# Patient Record
Sex: Male | Born: 1985 | Race: Black or African American | Hispanic: No | Marital: Single | Smoking: Current every day smoker
Health system: Southern US, Community
[De-identification: ages and names within clinical notes are randomized; demographics above are authoritative.]

## PROBLEM LIST (undated history)

## (undated) DIAGNOSIS — S62613A Displaced fracture of proximal phalanx of left middle finger, initial encounter for closed fracture: Secondary | ICD-10-CM

## (undated) DIAGNOSIS — S92353A Displaced fracture of fifth metatarsal bone, unspecified foot, initial encounter for closed fracture: Secondary | ICD-10-CM

## (undated) DIAGNOSIS — R0600 Dyspnea, unspecified: Secondary | ICD-10-CM

## (undated) DIAGNOSIS — S61412A Laceration without foreign body of left hand, initial encounter: Secondary | ICD-10-CM

## (undated) DIAGNOSIS — S6290XA Unspecified fracture of unspecified wrist and hand, initial encounter for closed fracture: Secondary | ICD-10-CM

## (undated) DIAGNOSIS — R0609 Other forms of dyspnea: Secondary | ICD-10-CM

## (undated) DIAGNOSIS — S62609A Fracture of unspecified phalanx of unspecified finger, initial encounter for closed fracture: Secondary | ICD-10-CM

## (undated) HISTORY — PX: MYRINGOTOMY: SUR874

## (undated) HISTORY — PX: HAND SURGERY: SHX662

---
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
## 2005-10-09 ENCOUNTER — Emergency Department (HOSPITAL_COMMUNITY): Admission: EM | Admit: 2005-10-09 | Discharge: 2005-10-09 | Payer: Self-pay | Admitting: Emergency Medicine

## 2006-01-06 ENCOUNTER — Emergency Department (HOSPITAL_COMMUNITY): Admission: EM | Admit: 2006-01-06 | Discharge: 2006-01-06 | Payer: Self-pay | Admitting: Emergency Medicine

## 2006-05-03 ENCOUNTER — Emergency Department (HOSPITAL_COMMUNITY): Admission: EM | Admit: 2006-05-03 | Discharge: 2006-05-03 | Payer: Self-pay | Admitting: Family Medicine

## 2009-01-04 ENCOUNTER — Emergency Department (HOSPITAL_COMMUNITY): Admission: EM | Admit: 2009-01-04 | Discharge: 2009-01-04 | Payer: Self-pay | Admitting: Emergency Medicine

## 2010-02-20 ENCOUNTER — Emergency Department (HOSPITAL_COMMUNITY): Admission: EM | Admit: 2010-02-20 | Discharge: 2010-02-20 | Payer: Self-pay | Admitting: Emergency Medicine

## 2010-03-04 ENCOUNTER — Emergency Department (HOSPITAL_COMMUNITY): Admission: EM | Admit: 2010-03-04 | Discharge: 2010-03-04 | Payer: Self-pay | Admitting: Family Medicine

## 2010-05-11 ENCOUNTER — Emergency Department (HOSPITAL_COMMUNITY): Admission: EM | Admit: 2010-05-11 | Discharge: 2010-05-11 | Payer: Self-pay | Admitting: Emergency Medicine

## 2010-05-30 ENCOUNTER — Emergency Department (HOSPITAL_COMMUNITY): Admission: EM | Admit: 2010-05-30 | Discharge: 2010-05-30 | Payer: Self-pay | Admitting: Emergency Medicine

## 2010-12-29 LAB — GC/CHLAMYDIA PROBE AMP, GENITAL
Chlamydia, DNA Probe: NEGATIVE
GC Probe Amp, Genital: NEGATIVE

## 2011-01-01 LAB — GC/CHLAMYDIA PROBE AMP, GENITAL
Chlamydia, DNA Probe: NEGATIVE
GC Probe Amp, Genital: NEGATIVE

## 2011-01-01 LAB — POCT URINALYSIS DIP (DEVICE)
Protein, ur: 30 mg/dL — AB
Specific Gravity, Urine: >=1.03 (ref 1.005–1.030)
Urobilinogen, UA: 1 mg/dL (ref 0.0–1.0)

## 2011-01-25 LAB — GC/CHLAMYDIA PROBE AMP, GENITAL: GC Probe Amp, Genital: POSITIVE — AB

## 2012-06-20 ENCOUNTER — Emergency Department (HOSPITAL_COMMUNITY)
Admission: EM | Admit: 2012-06-20 | Discharge: 2012-06-20 | Disposition: A | Discharge status: Home / Self Care | Payer: Self-pay | Source: Home / Self Care | Attending: Emergency Medicine | Admitting: Emergency Medicine

## 2012-06-20 ENCOUNTER — Encounter (HOSPITAL_COMMUNITY): Payer: Self-pay | Admitting: Emergency Medicine

## 2012-06-20 DIAGNOSIS — R369 Urethral discharge, unspecified: Secondary | ICD-10-CM

## 2012-06-20 DIAGNOSIS — Z202 Contact with and (suspected) exposure to infections with a predominantly sexual mode of transmission: Secondary | ICD-10-CM

## 2012-06-20 MED ORDER — AZITHROMYCIN 250 MG PO TABS
1000.0000 mg | ORAL_TABLET | Freq: Every day | ORAL | Status: DC
  Administered 2012-06-20: 1000 mg via ORAL

## 2012-06-20 MED ORDER — CEFTRIAXONE SODIUM 250 MG IJ SOLR
INTRAMUSCULAR | Status: AC
  Filled 2012-06-20: qty 250

## 2012-06-20 MED ORDER — AZITHROMYCIN 250 MG PO TABS
ORAL_TABLET | ORAL | Status: AC
  Filled 2012-06-20: qty 4

## 2012-06-20 MED ORDER — CEFTRIAXONE SODIUM 250 MG IJ SOLR
250.0000 mg | Freq: Once | INTRAMUSCULAR | Status: AC
  Administered 2012-06-20: 250 mg via INTRAMUSCULAR

## 2012-06-20 MED ORDER — METRONIDAZOLE 500 MG PO TABS: 500.0000 mg | ORAL_TABLET | Freq: Three times a day (TID) | ORAL | Status: AC

## 2012-06-20 MED ORDER — LIDOCAINE HCL (PF) 1 % IJ SOLN
INTRAMUSCULAR | Status: AC
  Filled 2012-06-20: qty 5

## 2012-06-20 NOTE — ED Notes (Addendum)
Pt c/o poss exposure to STD x1 week... States he had unprotected intercourse w/girlfriend who notified him that she is being treated for chlamydia.... Pt c/o lower abd pain and white discharge from penile area w/episodes of vomiting and diarrhea... Also hurts when he urinates.

## 2012-06-20 NOTE — ED Provider Notes (Signed)
History     CSN: [id number]  Arrival date & time 06/20/12  0801   None     Chief Complaint  Patient presents with  . Exposure to STD    (Consider location/radiation/quality/duration/timing/severity/associated sxs/prior treatment) Patient is a 26 y.o. male presenting with STD exposure. The history is provided by the patient.  Exposure to STD This is a new problem. Associated symptoms include abdominal pain.  26 y.o. male complains of yellow penis discharge for 3 days.  +pelvic pain, -fever. + UTI symptoms. Sexually active, sometimes uses condoms, new partner.  Last unprotected intercourse 7 days ago.  Partner called him last week and states + for gonorrhea and chlamydia.  Patient has a  +trich history 2 years ago.  History reviewed. No pertinent past medical history.  History reviewed. No pertinent past surgical history.  History reviewed. No pertinent family history.  History  Substance Use Topics  . Smoking status: Current Everyday Smoker  . Smokeless tobacco: Not on file  . Alcohol Use: Yes      Review of Systems  Constitutional: Negative.   HENT: Negative.   Respiratory: Negative.   Cardiovascular: Negative.   Gastrointestinal: Positive for abdominal pain and diarrhea. Negative for nausea and vomiting.  Genitourinary: Positive for dysuria and discharge. Negative for hematuria, flank pain, decreased urine volume, penile pain and testicular pain.    Allergies  Review of patient's allergies indicates no known allergies.  Home Medications   Current Outpatient Rx  Name Route Sig Dispense Refill  . METRONIDAZOLE 500 MG PO TABS Oral Take 1 tablet (500 mg total) by mouth 3 (three) times daily. 4 tablet 0    BP 127/74  Pulse 83  Temp 98.1 F (36.7 C) (Oral)  Resp 18  SpO2 100%  Physical Exam  Nursing note and vitals reviewed. Constitutional: He is oriented to person, place, and time. Vital signs are normal. He appears well-developed and well-nourished. He is  active and cooperative.  HENT:  Head: Normocephalic.  Mouth/Throat: Oropharynx is clear and moist. No oropharyngeal exudate.  Eyes: Conjunctivae are normal. Pupils are equal, round, and reactive to light. No scleral icterus.  Neck: Trachea normal. Neck supple.  Cardiovascular: Normal rate, regular rhythm and normal heart sounds.   Pulmonary/Chest: Effort normal and breath sounds normal.  Abdominal: Soft. There is tenderness.  Genitourinary: Penis normal. Cremasteric reflex is present. Right testis shows tenderness. Left testis shows tenderness. Circumcised.  Lymphadenopathy:    He has no cervical adenopathy.    He has no axillary adenopathy.       Right: Inguinal adenopathy present.       Left: Inguinal adenopathy present.  Neurological: He is alert and oriented to person, place, and time. No cranial nerve deficit or sensory deficit.  Skin: Skin is warm and dry. No rash noted.  Psychiatric: He has a normal mood and affect. His speech is normal and behavior is normal. Judgment and thought content normal. Cognition and memory are normal.    ED Course  Procedures (including critical care time)   Labs Reviewed  GC/CHLAMYDIA PROBE AMP, GENITAL  RPR  HIV ANTIBODY (ROUTINE TESTING)   No results found.   1. Exposure to STD   2. Penile discharge       MDM  STD exposure.  Sent off GC/chlamydia, HIV, RPR. Will treat empirically now. Giving ceftriaxone 250 mg IM/azithro 1 gm po. Will send home with flagyl abx.  Advised pt to refrain from sexual contact for seven days.  Partners will  need screening and treatment.           Johnsie Kindred, NP 06/20/12 [phone]

## 2012-06-20 NOTE — Discharge Instructions (Signed)
Gonorrhea, Females and Males  Gonorrhea is an infection. Gonorrhea can be treated with medicines that kill germs (antibiotics). It is necessary that all your sexual partners also be tested for infection and possibly be treated.   CAUSES   Gonorrhea is caused by a germ (bacteria) called Neisseria gonorrhoeae. This infection is spread by sexual contact. The contact that spreads gonorrhea from person to person may be oral, anal, or genital sex.  SYMPTOMS   Females  A woman may have gonorrhea infection and no symptoms. The most common symptoms are:   Pain in the lower abdomen.   Fever, with or without chills.  When these are the most serious problems, the illness is commonly called pelvic inflammatory disease (PID). Other symptoms include:   Abnormal vaginal discharge.   Painful intercourse.   Burning or itching of the vagina or lips of the vagina.   Abnormal vaginal bleeding.   Pain when urinating.  If the infection is spread by anal sex:   Irritation, pain, bleeding, or discharge from the rectum.  If the infection is spread by oral sex with either a man or a woman:   Sore throat, fever, and swollen neck lymph glands.  Other problems may include:   Long-lasting (chronic) pain in the lower abdomen during menstruation, intercourse, or at other times.   Inability to become pregnant.   Premature birth.   Passing the infection onto a newborn baby. This can cause an eye infection in the infant or more serious health problems.  Males  Less frequently than in women, men may have gonorrhea infection and no symptoms. The most common symptoms are:   Discharge from the penis.   Pain or burning during urination.  If the infection is spread by anal sex:   Irritation, pain, bleeding, or discharge from the rectum.  If the infection is spread by oral sex with either a man or a woman:   Sore throat, fever, and swollen neck lymph glands.  DIAGNOSIS   Diagnosis is made by exam of the patient and checking a sample of  discharge under a microscope for the presence of the bacteria. Discharge may be taken from the urethra, cervix, throat, or rectum.  TREATMENT   It is important to diagnose and treat gonorrhea as soon as possible. This prevents damage to the male or male organs or harm to the newborn baby of an infected woman.   Antibiotics are used to treat gonorrhea.   Your sex partners should also be examined and treated if needed.   Testing and treatment for other sexually transmitted diseases (STDs) may be done when you are diagnosed with gonorrhea. Gonorrhea is an STD. You are at risk for other STDs, which are often transmitted around the same time as gonorrhea. These include:   Chlamydia.   Syphilis.   Trichomonas.   Human papillomavirus (HPV).   Human immunodeficiency virus (HIV).   If left untreated, PID can cause women to be unable to have children (sterile). To prevent sterility in females, it is important to be treated as soon as possible and finish all medicines. Unfortunately, sterility or pregnancy occurring outside the uterus (ectopic) may still occur in fully treated women.  HOME CARE INSTRUCTIONS    Finish all medicine as prescribed. Incomplete treatment will put you at risk for continued infection.   Only take over-the-counter or prescription medicines for pain, discomfort, or fever as directed by your caregiver.   Do not have sex until treatment is completed, or as instructed   by your caregiver.   Follow up with your caregiver as directed.   If you test positive for gonorrhea, inform your recent sexual partners. They may need an exam and treatment, even if they have no symptoms. They may need treatment even if they test negative for gonorrhea.  Finding out the results of your test  Not all test results are available during your visit. If your test results are not back during the visit, make an appointment with your caregiver to find out the results. Do not assume everything is normal if you have not  heard from your caregiver or the medical facility. It is important for you to follow up on all of your test results.  SEEK MEDICAL CARE IF:    You develop any bad reaction to the medicine you were prescribed. This may include:   Rash.   Nausea.   Vomiting.   Diarrhea.   You have an oral temperature above 102 F (38.9 C).   You have symptoms that do not improve, symptoms that get worse, or you develop increased pain. Males may get pain in the testicles and females may get increased abdominal pain.  MAKE SURE YOU:    Understand these instructions.   Will watch your condition.   Will get help right away if you are not doing well or get worse.  Document Released: 09/28/2000 Document Revised: 09/20/2011 Document Reviewed: 01/31/2010  Exit[employer] Patient Information 2012 [employer].

## 2012-06-20 NOTE — ED Notes (Signed)
Call from pharmacy, requesting clarification on medication dose schedule; spoke w provider, and then directly w pharmacist to give instructions

## 2012-06-21 LAB — GC/CHLAMYDIA PROBE AMP, GENITAL: Chlamydia, DNA Probe: NEGATIVE

## 2012-06-22 NOTE — ED Provider Notes (Signed)
Medical screening examination/treatment/procedure(s) were performed by non-physician practitioner and as supervising physician I was immediately available for consultation/collaboration.  Karsin Pesta M. MD   Maleni Seyer M Brixton Schnapp, MD 06/22/12 2132 

## 2013-10-04 ENCOUNTER — Encounter (HOSPITAL_COMMUNITY): Payer: Self-pay | Admitting: Emergency Medicine

## 2013-10-04 ENCOUNTER — Emergency Department (INDEPENDENT_AMBULATORY_CARE_PROVIDER_SITE_OTHER)
Admission: EM | Admit: 2013-10-04 | Discharge: 2013-10-04 | Disposition: A | Discharge status: Home / Self Care | Payer: Medicaid Other | Source: Home / Self Care | Attending: Family Medicine | Admitting: Family Medicine

## 2013-10-04 ENCOUNTER — Other Ambulatory Visit (HOSPITAL_COMMUNITY)
Admission: RE | Admit: 2013-10-04 | Discharge: 2013-10-04 | Disposition: A | Discharge status: Home / Self Care | Payer: Medicaid Other | Source: Ambulatory Visit | Attending: Family Medicine | Admitting: Family Medicine

## 2013-10-04 DIAGNOSIS — Z113 Encounter for screening for infections with a predominantly sexual mode of transmission: Secondary | ICD-10-CM | POA: Insufficient documentation

## 2013-10-04 DIAGNOSIS — N342 Other urethritis: Secondary | ICD-10-CM

## 2013-10-04 DIAGNOSIS — Z202 Contact with and (suspected) exposure to infections with a predominantly sexual mode of transmission: Secondary | ICD-10-CM

## 2013-10-04 LAB — POCT URINALYSIS DIP (DEVICE)
Bilirubin Urine: NEGATIVE
Glucose, UA: NEGATIVE mg/dL
Hgb urine dipstick: NEGATIVE
Ketones, ur: NEGATIVE mg/dL
Specific Gravity, Urine: 1.015 (ref 1.005–1.030)

## 2013-10-04 MED ORDER — CEFTRIAXONE SODIUM 250 MG IJ SOLR
250.0000 mg | Freq: Once | INTRAMUSCULAR | Status: AC
  Administered 2013-10-04: 250 mg via INTRAMUSCULAR

## 2013-10-04 MED ORDER — CEFTRIAXONE SODIUM 250 MG IJ SOLR
INTRAMUSCULAR | Status: AC
  Filled 2013-10-04: qty 250

## 2013-10-04 MED ORDER — AZITHROMYCIN 250 MG PO TABS
ORAL_TABLET | ORAL | Status: AC
  Filled 2013-10-04: qty 4

## 2013-10-04 MED ORDER — AZITHROMYCIN 250 MG PO TABS
1000.0000 mg | ORAL_TABLET | Freq: Every day | ORAL | Status: DC
  Administered 2013-10-04: 1000 mg via ORAL

## 2013-10-04 MED ORDER — LIDOCAINE HCL (PF) 1 % IJ SOLN
INTRAMUSCULAR | Status: AC
  Filled 2013-10-04: qty 5

## 2013-10-04 MED ORDER — METRONIDAZOLE 500 MG PO TABS: 1000.0000 mg | ORAL_TABLET | Freq: Two times a day (BID) | ORAL | Status: DC

## 2013-10-04 NOTE — ED Notes (Signed)
27 yr old is here today with complaints of penile discharge-pale yellow and abdominal pain for 4 dys. He states that he was advised by the person he was intimate with had been Dx Gonorrhea, chlamydia, and Trichomonas.  Denies: SOB, chest pain

## 2013-10-04 NOTE — ED Provider Notes (Signed)
CSN: [id number]     Arrival date & time 10/04/13  1149 History   First MD Initiated Contact with Patient 10/04/13 1238     Chief Complaint  Patient presents with  . Exposure to STD   (Consider location/radiation/quality/duration/timing/severity/associated sxs/prior Treatment) HPI Comments: A 27-year-old male presents complaining of penile discharge, lower abdominal pain, diarrhea, sore throat. This is been going on for about 2 days. He was called by someone he was intimate with recently, she was treated for Chlamydia, gonorrhea, Trichomonas. There is no blood in the diarrhea. He believes the area may actually be because he drank a lot this weekend. No fever, chills, nausea, vomiting, genital lesions, testicle pain  Patient is a 27 y.o. male presenting with STD exposure.  Exposure to STD Associated symptoms include abdominal pain. Pertinent negatives include no chest pain and no shortness of breath.    History reviewed. No pertinent past medical history. History reviewed. No pertinent past surgical history. No family history on file. History  Substance Use Topics  . Smoking status: Current Every Day Smoker  . Smokeless tobacco: Not on file  . Alcohol Use: Yes    Review of Systems  Constitutional: Positive for appetite change. Negative for fever, chills and fatigue.  HENT: Positive for sore throat.   Eyes: Negative for visual disturbance.  Respiratory: Negative for cough and shortness of breath.   Cardiovascular: Negative for chest pain, palpitations and leg swelling.  Gastrointestinal: Positive for abdominal pain. Negative for nausea, vomiting, diarrhea and constipation.  Genitourinary: Positive for discharge. Negative for dysuria, urgency, frequency, hematuria and penile pain.  Musculoskeletal: Negative for arthralgias, myalgias, neck pain and neck stiffness.  Skin: Negative for rash.  Neurological: Negative for dizziness, weakness and light-headedness.    Allergies  Review of  patient's allergies indicates no known allergies.  Home Medications   Current Outpatient Rx  Name  Route  Sig  Dispense  Refill  . metroNIDAZOLE (FLAGYL) 500 MG tablet   Oral   Take 2 tablets (1,000 mg total) by mouth every 12 (twelve) hours.   4 tablet   0    BP 121/67  Pulse 63  Temp(Src) 98.4 F (36.9 C) (Oral)  Resp 14  SpO2 100% Physical Exam  Nursing note and vitals reviewed. Constitutional: He is oriented to person, place, and time. He appears well-developed and well-nourished. No distress.  HENT:  Head: Normocephalic and atraumatic.  Mouth/Throat: Mucous membranes are normal. Oropharyngeal exudate (left tonsil) and posterior oropharyngeal erythema present.  Pulmonary/Chest: Effort normal. No respiratory distress.  Abdominal: Hernia confirmed negative in the right inguinal area and confirmed negative in the left inguinal area.  Genitourinary: Testes normal. Right testis shows no mass, no swelling and no tenderness. Left testis shows no mass, no swelling and no tenderness. Discharge found.  Lymphadenopathy:       Right: Inguinal adenopathy present.       Left: Inguinal adenopathy present.  Neurological: He is alert and oriented to person, place, and time. Coordination normal.  Skin: Skin is warm and dry. No rash noted. He is not diaphoretic.  Psychiatric: He has a normal mood and affect. Judgment normal.    ED Course  Procedures (including critical care time) Labs Review Labs Reviewed  URINE CULTURE  RPR  HIV ANTIBODY (ROUTINE TESTING)  POCT URINALYSIS DIP (DEVICE)  POCT RAPID STREP A ([facility] CARE ONLY)  URINE CYTOLOGY ANCILLARY ONLY   Imaging Review No results found.    MDM   1. Exposure to STD  2. Urethritis    Treating for Gc, Ch, trich.  F/U if not improving   Meds ordered this encounter  Medications  . cefTRIAXone (ROCEPHIN) injection 250 mg    Sig:   . azithromycin (ZITHROMAX) tablet 1,000 mg    Sig:   . metroNIDAZOLE (FLAGYL) 500 MG  tablet    Sig: Take 2 tablets (1,000 mg total) by mouth every 12 (twelve) hours.    Dispense:  4 tablet    Refill:  0    Order Specific Question:  Supervising Provider    Answer:  Clementeen Graham, Kathie Rhodes [[id number]]       Graylon Good, PA-C 10/04/13 1332

## 2013-10-05 LAB — RPR: RPR Ser Ql: NONREACTIVE

## 2013-10-05 LAB — URINE CULTURE

## 2013-10-05 LAB — HIV ANTIBODY (ROUTINE TESTING W REFLEX): HIV: NONREACTIVE

## 2013-10-05 NOTE — ED Provider Notes (Signed)
Medical screening examination/treatment/procedure(s) were performed by a resident physician or non-physician practitioner and as the supervising physician I was immediately available for consultation/collaboration.  Clementeen Graham, MD    Rodolph Bong, MD 10/05/13 [phone]

## 2013-10-06 LAB — CULTURE, GROUP A STREP

## 2014-09-06 ENCOUNTER — Encounter (HOSPITAL_COMMUNITY): Payer: Self-pay | Admitting: *Deleted

## 2014-09-06 ENCOUNTER — Emergency Department (HOSPITAL_COMMUNITY): Payer: No Typology Code available for payment source

## 2014-09-06 ENCOUNTER — Emergency Department (HOSPITAL_COMMUNITY)
Admission: EM | Admit: 2014-09-06 | Discharge: 2014-09-06 | Disposition: A | Discharge status: Home / Self Care | Payer: No Typology Code available for payment source | Attending: Emergency Medicine | Admitting: Emergency Medicine

## 2014-09-06 DIAGNOSIS — Z792 Long term (current) use of antibiotics: Secondary | ICD-10-CM | POA: Insufficient documentation

## 2014-09-06 DIAGNOSIS — Y92410 Unspecified street and highway as the place of occurrence of the external cause: Secondary | ICD-10-CM | POA: Diagnosis not present

## 2014-09-06 DIAGNOSIS — Y998 Other external cause status: Secondary | ICD-10-CM | POA: Insufficient documentation

## 2014-09-06 DIAGNOSIS — S39012A Strain of muscle, fascia and tendon of lower back, initial encounter: Secondary | ICD-10-CM | POA: Diagnosis not present

## 2014-09-06 DIAGNOSIS — Y9389 Activity, other specified: Secondary | ICD-10-CM | POA: Insufficient documentation

## 2014-09-06 DIAGNOSIS — S169XXA Unspecified injury of muscle, fascia and tendon at neck level, initial encounter: Secondary | ICD-10-CM | POA: Diagnosis present

## 2014-09-06 DIAGNOSIS — S161XXA Strain of muscle, fascia and tendon at neck level, initial encounter: Secondary | ICD-10-CM | POA: Insufficient documentation

## 2014-09-06 DIAGNOSIS — Z720 Tobacco use: Secondary | ICD-10-CM | POA: Diagnosis not present

## 2014-09-06 DIAGNOSIS — T148XXA Other injury of unspecified body region, initial encounter: Secondary | ICD-10-CM

## 2014-09-06 DIAGNOSIS — R52 Pain, unspecified: Secondary | ICD-10-CM

## 2014-09-06 MED ORDER — CYCLOBENZAPRINE HCL 10 MG PO TABS: 10.0000 mg | ORAL_TABLET | Freq: Two times a day (BID) | ORAL | Status: DC | PRN

## 2014-09-06 MED ORDER — IBUPROFEN 800 MG PO TABS: 800.0000 mg | ORAL_TABLET | Freq: Three times a day (TID) | ORAL | Status: DC

## 2014-09-06 MED ORDER — TRAMADOL HCL 50 MG PO TABS: 50.0000 mg | ORAL_TABLET | Freq: Four times a day (QID) | ORAL | Status: DC | PRN
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
## 2015-01-20 ENCOUNTER — Emergency Department (HOSPITAL_COMMUNITY)
Admission: EM | Admit: 2015-01-20 | Discharge: 2015-01-20 | Disposition: A | Discharge status: Home / Self Care | Payer: Medicaid Other | Attending: Emergency Medicine | Admitting: Emergency Medicine

## 2015-01-20 ENCOUNTER — Encounter (HOSPITAL_COMMUNITY): Payer: Self-pay | Admitting: Emergency Medicine

## 2015-01-20 DIAGNOSIS — Z720 Tobacco use: Secondary | ICD-10-CM | POA: Insufficient documentation

## 2015-01-20 DIAGNOSIS — Y998 Other external cause status: Secondary | ICD-10-CM | POA: Insufficient documentation

## 2015-01-20 DIAGNOSIS — S4991XA Unspecified injury of right shoulder and upper arm, initial encounter: Secondary | ICD-10-CM | POA: Insufficient documentation

## 2015-01-20 DIAGNOSIS — Y9389 Activity, other specified: Secondary | ICD-10-CM | POA: Insufficient documentation

## 2015-01-20 DIAGNOSIS — Y92410 Unspecified street and highway as the place of occurrence of the external cause: Secondary | ICD-10-CM | POA: Insufficient documentation

## 2015-01-20 DIAGNOSIS — S8991XA Unspecified injury of right lower leg, initial encounter: Secondary | ICD-10-CM | POA: Insufficient documentation

## 2015-01-20 MED ORDER — IBUPROFEN 200 MG PO TABS
600.0000 mg | ORAL_TABLET | Freq: Once | ORAL | Status: AC
  Administered 2015-01-20: 600 mg via ORAL
  Filled 2015-01-20: qty 3

## 2015-01-20 MED ORDER — IBUPROFEN 600 MG PO TABS: 600.0000 mg | ORAL_TABLET | Freq: Three times a day (TID) | ORAL | Status: DC
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
## 2016-01-16 ENCOUNTER — Emergency Department (HOSPITAL_COMMUNITY): Payer: Self-pay

## 2016-01-16 ENCOUNTER — Emergency Department (HOSPITAL_COMMUNITY)
Admission: EM | Admit: 2016-01-16 | Discharge: 2016-01-16 | Disposition: A | Discharge status: Home / Self Care | Payer: Self-pay | Attending: Emergency Medicine | Admitting: Emergency Medicine

## 2016-01-16 ENCOUNTER — Encounter (HOSPITAL_COMMUNITY): Payer: Self-pay

## 2016-01-16 DIAGNOSIS — Y9389 Activity, other specified: Secondary | ICD-10-CM | POA: Insufficient documentation

## 2016-01-16 DIAGNOSIS — Y998 Other external cause status: Secondary | ICD-10-CM | POA: Insufficient documentation

## 2016-01-16 DIAGNOSIS — Y9289 Other specified places as the place of occurrence of the external cause: Secondary | ICD-10-CM | POA: Insufficient documentation

## 2016-01-16 DIAGNOSIS — F17210 Nicotine dependence, cigarettes, uncomplicated: Secondary | ICD-10-CM | POA: Insufficient documentation

## 2016-01-16 DIAGNOSIS — S92352A Displaced fracture of fifth metatarsal bone, left foot, initial encounter for closed fracture: Secondary | ICD-10-CM | POA: Insufficient documentation

## 2016-01-16 DIAGNOSIS — X58XXXA Exposure to other specified factors, initial encounter: Secondary | ICD-10-CM | POA: Insufficient documentation

## 2016-01-16 DIAGNOSIS — S99912A Unspecified injury of left ankle, initial encounter: Secondary | ICD-10-CM | POA: Insufficient documentation

## 2016-01-16 MED ORDER — TRAMADOL HCL 50 MG PO TABS: 50.0000 mg | ORAL_TABLET | Freq: Once | ORAL | Status: AC

## 2016-01-16 MED ORDER — IBUPROFEN 600 MG PO TABS: 600.0000 mg | ORAL_TABLET | Freq: Four times a day (QID) | ORAL | Status: DC | PRN

## 2016-01-16 MED ORDER — TRAMADOL HCL 50 MG PO TABS: 50.0000 mg | ORAL_TABLET | Freq: Once | ORAL | Status: DC

## 2016-01-16 NOTE — ED Notes (Signed)
Pt c/o L foot/ankle pain x 3 days.  Pain score 9/10.  Pt reports taking OTC pain medication w/o relief.  Pt reports that he "twisted" his ankle.  No swelling noted.

## 2016-01-16 NOTE — ED Notes (Signed)
Patient transported to X-ray 

## 2016-01-16 NOTE — Discharge Instructions (Signed)
You have been placed in a splint/cast that can be removed at night and for bathing If you are up and about you MUST wear the splint Call the orthopedist for follow   Metatarsal Fracture With Rehab A metatarsal fracture is a broken bone in one of the five bones that connect your toes to the rest of your foot (forefoot fracture). Metatarsals are long bones that can be stressed or cracked easily. A metatarsal fracture can be:  A stress fracture. Stress fractures are cracks in the surface of the metatarsal bone. Athletes often get stress fractures.  A complete fracture. A complete fracture goes all the way through the bone. The bone that connects to the pinky toe (fifth metatarsal) is the most commonly fractured metatarsal. Ballet dancers often fracture this bone. CAUSES  Stress fractures may be caused by:  Poor training technique.  Sudden increase in activity.  Changing your activity to a harder surface.  Wearing athletic shoes that do not have enough cushioning. Complete fractures are usually caused by:  Dropping a heavy object on your foot.  An injury that severely twists your foot. SIGNS AND SYMPTOMS The most common symptom of a stress fracture is foot pain that goes away with rest. The most common symptom of a complete fracture is intense pain that persists after the injury. Other symptoms of both fracture types include:  Bruising.  Swelling.  Pain with movement or putting weight on the foot.  Tenderness or pain with pressure.  Trouble walking. DIAGNOSIS  Your health care provider may suspect a metatarsal fracture based on your symptoms and medical history. Your health care provider will also do a physical exam. During the physical exam, your health care provider may try to move your foot and toes to check for pain and limited movement. Your foot will also be checked for:  Bruising.  Tenderness.  Swelling.  Deformity. Other tests that may be done include:  X-rays.  X-rays are able to show most fractures.  A bone scan. This test may be necessary to show a stress fracture. TREATMENT  Treatment for a metatarsal fracture depends on how severe the fracture was and the type of fracture. Treatment may include:  Surgery. Surgery is usually needed to repair a displaced fracture. A displaced fracture happens when pieces of the broken bone are moved out of place (displacement). After surgery, you may need to wear a short walking cast for 6 to 8 weeks.  Medicines. Medicines may be used to reduce swelling and pain.  Physical therapy. Physical therapy may last for several months.  Use of a supportive device, such as:  Elastic wrap.  Splint.  Boot.  Cast.  Crutches to support weight on your foot until the broken bone heals. You can usually treat a stress fracture or a nondisplaced fracture with:   Rest.  Ice.  Elevation.  Support. HOME CARE INSTRUCTIONS  Follow all your health care provider's instructions.  Take medicine only as directed by your health care provider.  Rest your foot until your health care provider says you can resume your usual activities.  When resting, keep your foot raised above the level of your heart (elevated).  Ice may help reduce pain and swelling.  Place ice in a plastic bag.  Place a towel between your skin and the bag.  Leave the ice on for 20 minutes, 2-3 times a day.  Wear your supportive device as directed.  Do not get your cast or splint wet.  Keep all follow-up  visits as directed by your health care provider. If your health care provider recommended physical therapy, it is very important for proper healing of your injury that you keep all visits. PREVENTION  After your fracture has healed, you can prevent another fracture by:  Starting new sports activities gradually.  Cross training to avoid putting stress on the same part of your foot every day (such as alternate running with swimming or  biking).  Eat a healthy diet that includes plenty of calcium and vitamin D.  Wear the right athletic shoes for your sport. Replace them when they wear out.  Stop your activity or training if you have pain or swelling in your foot. Rest for a few days. SEEK MEDICAL CARE IF:  You have pain that is getting worse.  You develop chills or fever.  Your foot feels numb.  Your cast or splint is damaged.  You notice swelling or redness below your cast or splint. WHAT REHABILITATION EXERCISES CAN I DO AT HOME? Ask your health care provider or physical therapist when you can start doing exercises at home. Doing these exercises 3-5 times a week can help you regain strength and flexibility. If doing any of these exercises causes pain, stop and contact your health care provider or physical therapist. Heel Cord Stretch Do 2 sets of 10 repetitions.  Stand facing a wall with your healthy foot forward and your knee slightly bent.  Place both hands on the wall for support.  Stretch your healing foot out straight behind you.  Keep both heels on the floor.  Hold the stretch for 30 seconds.  You can also do this exercise with both knees slightly bent. Repeat the same steps. Golf BJ'sBall Roll Do this once every day.  Sit on a chair and roll a golf ball under your healing foot for two minutes. Towel Stretch  Sit on the floor with your legs out straight.  Loop a towel around the front of your healing foot.  Use both hands to pull the ends of the towel, stretching your foot back toward your body.  Hold the stretch for 30 seconds and repeat 3 times. Calf Raises Do 2 sets of 10 repetitions.  Stand behind a chair and use your hands to support you.  Lift your good foot off the ground.  Rise up on the front of your healing foot as high as you can.  Repeat the lift 10 times. Marble Pickup Do this once a day.  Sit in a chair and put 20 marbles on the floor in front of you.  Use the toes of  your healing foot to pick up each marble. Towel Curls Repeat this exercise 5 times.  Sit in a chair and place a small towel on the floor in front of you.  Use the toes of your healing foot to grab the towel and pull it toward you.   This information is not intended to replace advice given to you by your health care provider. Make sure you discuss any questions you have with your health care provider.   Document Released: 10/01/2005 Document Revised: 02/15/2015 Document Reviewed: 01/14/2014 Elsevier Interactive Patient Education [employer]
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
## 2016-01-18 ENCOUNTER — Telehealth: Payer: Self-pay | Admitting: *Deleted

## 2016-01-18 NOTE — Telephone Encounter (Signed)
Pharmacy called related to Rx: traMADol (ULTRAM) 50 MG tablet 01/16/16 -- Earley FavorGail Schulz, NP Take 1 tablet (50 mg total) by mouth once.  ..Marland Kitchen.EDCM clarified with EDP to change Rx to: q 8 hours PRN severe pain.

## 2016-08-17 ENCOUNTER — Emergency Department (HOSPITAL_COMMUNITY)
Admission: EM | Admit: 2016-08-17 | Discharge: 2016-08-17 | Disposition: A | Discharge status: Home / Self Care | Payer: Medicaid Other | Attending: Emergency Medicine | Admitting: Emergency Medicine

## 2016-08-17 ENCOUNTER — Encounter (HOSPITAL_COMMUNITY): Payer: Self-pay | Admitting: Emergency Medicine

## 2016-08-17 DIAGNOSIS — F17210 Nicotine dependence, cigarettes, uncomplicated: Secondary | ICD-10-CM | POA: Insufficient documentation

## 2016-08-17 DIAGNOSIS — J029 Acute pharyngitis, unspecified: Secondary | ICD-10-CM

## 2016-08-17 DIAGNOSIS — H9203 Otalgia, bilateral: Secondary | ICD-10-CM | POA: Insufficient documentation

## 2016-08-17 MED ORDER — IBUPROFEN 800 MG PO TABS: 800.0000 mg | ORAL_TABLET | Freq: Three times a day (TID) | ORAL | 0 refills | Status: DC

## 2016-08-17 NOTE — ED Notes (Signed)
Patient states he has bilateral ear pain and a sore throat. Also states he has nasal congestion.

## 2016-08-17 NOTE — ED Provider Notes (Signed)
[facility]-EMERGENCY DEPT Provider Note   CSN: [id number] Arrival date & time: 08/17/16  [id number]     History   Chief Complaint Chief Complaint  Patient presents with  . Otalgia  . Sore Throat    HPI Alfred Morrison is a 30 y.o. male.  HPI History reviewed. No pertinent past medical history. Patient states he has a earache and sore throat. These have been present for about a week. Both ears hurt. Symptoms are made worse by wind blowing on his ears. No known fever. No difficulty swallowing or breathing. Mild associated nasal congestion. There are no active problems to display for this patient.   History reviewed. No pertinent surgical history.     Home Medications    Prior to Admission medications   Medication Sig Start Date End Date Taking? Authorizing Provider  ibuprofen (ADVIL,MOTRIN) 600 MG tablet Take 1 tablet (600 mg total) by mouth every 6 (six) hours as needed for moderate pain. 01/16/16   Earley FavorGail Schulz, NP  ibuprofen (ADVIL,MOTRIN) 800 MG tablet Take 1 tablet (800 mg total) by mouth 3 (three) times daily. 08/17/16   Arby BarretteMarcy Addis Bennie, MD  traMADol (ULTRAM) 50 MG tablet Take 1 tablet (50 mg total) by mouth once. 01/16/16   Earley FavorGail Schulz, NP    Family History History reviewed. No pertinent family history.  Social History Social History  Substance Use Topics  . Smoking status: Current Every Day Smoker    Packs/day: 0.50    Types: Cigarettes  . Smokeless tobacco: Never Used  . Alcohol use Yes     Comment: occasionally     Allergies   Review of patient's allergies indicates no known allergies.   Review of Systems Review of Systems  Constitutional: No fevers no chills no malaise Respiratory: No cough no shortness of breath no chest pain GI: No nausea vomiting or diarrhea Physical Exam Updated Vital Signs BP 129/87 (BP Location: Left Arm)   Pulse 79   Temp 98.4 F (36.9 C) (Oral)   Resp 17   Ht 6\' 1"  (1.854 m)   Wt 155 lb (70.3 kg)   SpO2 99%   BMI 20.45 kg/m    Physical Exam  Constitutional: He is oriented to person, place, and time. He appears well-developed and well-nourished.  HENT:  Morrison: Normocephalic and atraumatic.  Bilateral TMs normal without erythema or bulging. Posterior oropharynx is widely patent with no erythema no exudate no vesicles.  Eyes: Conjunctivae and EOM are normal. Pupils are equal, round, and reactive to light.  Neck: Neck supple. No thyromegaly present.  Cardiovascular: Normal rate and regular rhythm.   No murmur heard. Pulmonary/Chest: Effort normal and breath sounds normal. No stridor. No respiratory distress.  Abdominal: He exhibits no distension.  Musculoskeletal: Normal range of motion.  Lymphadenopathy:    He has no cervical adenopathy.  Neurological: He is alert and oriented to person, place, and time. No cranial nerve deficit. Coordination normal.  Skin: Skin is warm and dry.  Psychiatric: He has a normal mood and affect.  Nursing note and vitals reviewed.    ED Treatments / Results  Labs (all labs ordered are listed, but only abnormal results are displayed) Labs Reviewed - No data to display  EKG  EKG Interpretation None       Radiology No results found.  Procedures Procedures (including critical care time)  Medications Ordered in ED Medications - No data to display   Initial Impression / Assessment and Plan / ED Course  I have reviewed the  triage vital signs and the nursing notes.  Pertinent labs & imaging results that were available during my care of the patient were reviewed by me and considered in my medical decision making (see chart for details).  Clinical Course    Final Clinical Impressions(s) / ED Diagnoses   Final diagnoses:  Otalgia of both ears  Pharyngitis, unspecified etiology  Minor URI symptoms without objective findings. Patient is well appearance. He is advised to take ibuprofen for a control. Patient also reports he needs a work excuse for last night.  New  Prescriptions New Prescriptions   IBUPROFEN (ADVIL,MOTRIN) 800 MG TABLET    Take 1 tablet (800 mg total) by mouth 3 (three) times daily.     Arby BarretteMarcy Mercy Leppla, MD 08/17/16 [phone]

## 2016-08-17 NOTE — ED Triage Notes (Signed)
Pt c/o sore throat and bilateral ear pain with nasal congestion x2 days. Denies cough,chest pain and SOB.

## 2018-02-15 ENCOUNTER — Emergency Department (HOSPITAL_COMMUNITY)
Admission: EM | Admit: 2018-02-15 | Discharge: 2018-02-15 | Disposition: A | Discharge status: Home / Self Care | Payer: Self-pay | Attending: Emergency Medicine | Admitting: Emergency Medicine

## 2018-02-15 ENCOUNTER — Encounter (HOSPITAL_COMMUNITY): Payer: Self-pay | Admitting: Emergency Medicine

## 2018-02-15 ENCOUNTER — Emergency Department (HOSPITAL_COMMUNITY): Payer: Self-pay

## 2018-02-15 DIAGNOSIS — Y9389 Activity, other specified: Secondary | ICD-10-CM | POA: Insufficient documentation

## 2018-02-15 DIAGNOSIS — S62613A Displaced fracture of proximal phalanx of left middle finger, initial encounter for closed fracture: Secondary | ICD-10-CM

## 2018-02-15 DIAGNOSIS — W11XXXA Fall on and from ladder, initial encounter: Secondary | ICD-10-CM | POA: Insufficient documentation

## 2018-02-15 DIAGNOSIS — Y929 Unspecified place or not applicable: Secondary | ICD-10-CM | POA: Insufficient documentation

## 2018-02-15 DIAGNOSIS — Y999 Unspecified external cause status: Secondary | ICD-10-CM | POA: Insufficient documentation

## 2018-02-15 DIAGNOSIS — F17210 Nicotine dependence, cigarettes, uncomplicated: Secondary | ICD-10-CM | POA: Insufficient documentation

## 2018-02-15 HISTORY — DX: Displaced fracture of proximal phalanx of left middle finger, initial encounter for closed fracture: S62.613A

## 2018-02-15 MED ORDER — HYDROCODONE-ACETAMINOPHEN 5-325 MG PO TABS
2.0000 | ORAL_TABLET | Freq: Once | ORAL | Status: AC
  Administered 2018-02-15: 2 via ORAL
  Filled 2018-02-15: qty 2

## 2018-02-15 MED ORDER — BUPIVACAINE HCL 0.5 % IJ SOLN
10.0000 mL | Freq: Once | INTRAMUSCULAR | Status: AC
  Administered 2018-02-15: 10 mL
  Filled 2018-02-15: qty 10

## 2018-02-15 MED ORDER — HYDROCODONE-ACETAMINOPHEN 5-325 MG PO TABS: 1.0000 | ORAL_TABLET | Freq: Four times a day (QID) | ORAL | 0 refills | Status: AC | PRN

## 2018-02-15 MED ORDER — LIDOCAINE HCL 2 % IJ SOLN
5.0000 mL | Freq: Once | INTRAMUSCULAR | Status: AC
  Administered 2018-02-15: 100 mg via INTRADERMAL
  Filled 2018-02-15: qty 20

## 2018-02-15 NOTE — ED Triage Notes (Signed)
Patient here from home with complaints of left hand pain after falling off ladder last Saturday. States that pain is increased in the left middle finger.

## 2018-02-15 NOTE — ED Notes (Signed)
Dr. Issacs at bedside 
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
## 2018-02-19 ENCOUNTER — Other Ambulatory Visit: Payer: Self-pay | Admitting: Orthopedic Surgery

## 2018-02-19 ENCOUNTER — Other Ambulatory Visit: Payer: Self-pay

## 2018-02-19 ENCOUNTER — Encounter (HOSPITAL_BASED_OUTPATIENT_CLINIC_OR_DEPARTMENT_OTHER): Payer: Self-pay

## 2018-02-19 NOTE — Progress Notes (Signed)
Spoke with: Jill Alexanders NPO:  No food after midnight/Clear liquids until 6:30AM DOS Arrival time: 10:30am Labs:N/A AM medications:  None Pre op orders:  Needs second sign Ride home:  Mae (grandmother) [phone]

## 2018-02-21 ENCOUNTER — Encounter (HOSPITAL_BASED_OUTPATIENT_CLINIC_OR_DEPARTMENT_OTHER)
Admission: RE | Disposition: A | Discharge status: Home / Self Care | Payer: Self-pay | Source: Ambulatory Visit | Attending: Orthopedic Surgery

## 2018-02-21 ENCOUNTER — Ambulatory Visit (HOSPITAL_BASED_OUTPATIENT_CLINIC_OR_DEPARTMENT_OTHER): Payer: Self-pay | Admitting: Anesthesiology

## 2018-02-21 ENCOUNTER — Ambulatory Visit (HOSPITAL_BASED_OUTPATIENT_CLINIC_OR_DEPARTMENT_OTHER)
Admission: RE | Admit: 2018-02-21 | Discharge: 2018-02-21 | Disposition: A | Discharge status: Home / Self Care | Payer: Self-pay | Source: Ambulatory Visit | Attending: Orthopedic Surgery | Admitting: Orthopedic Surgery

## 2018-02-21 ENCOUNTER — Encounter (HOSPITAL_BASED_OUTPATIENT_CLINIC_OR_DEPARTMENT_OTHER): Payer: Self-pay | Admitting: Anesthesiology

## 2018-02-21 DIAGNOSIS — S62613A Displaced fracture of proximal phalanx of left middle finger, initial encounter for closed fracture: Secondary | ICD-10-CM | POA: Insufficient documentation

## 2018-02-21 DIAGNOSIS — W1789XA Other fall from one level to another, initial encounter: Secondary | ICD-10-CM | POA: Insufficient documentation

## 2018-02-21 DIAGNOSIS — Y92007 Garden or yard of unspecified non-institutional (private) residence as the place of occurrence of the external cause: Secondary | ICD-10-CM | POA: Insufficient documentation

## 2018-02-21 DIAGNOSIS — F17210 Nicotine dependence, cigarettes, uncomplicated: Secondary | ICD-10-CM | POA: Insufficient documentation

## 2018-02-21 DIAGNOSIS — Z79899 Other long term (current) drug therapy: Secondary | ICD-10-CM | POA: Insufficient documentation

## 2018-02-21 HISTORY — DX: Laceration without foreign body of left hand, initial encounter: S61.412A

## 2018-02-21 HISTORY — PX: OPEN REDUCTION INTERNAL FIXATION (ORIF) PROXIMAL PHALANX: SHX6235

## 2018-02-21 HISTORY — DX: Other forms of dyspnea: R06.09

## 2018-02-21 HISTORY — DX: Fracture of unspecified phalanx of unspecified finger, initial encounter for closed fracture: S62.609A

## 2018-02-21 HISTORY — DX: Unspecified fracture of unspecified wrist and hand, initial encounter for closed fracture: S62.90XA

## 2018-02-21 HISTORY — DX: Dyspnea, unspecified: R06.00

## 2018-02-21 HISTORY — DX: Displaced fracture of proximal phalanx of left middle finger, initial encounter for closed fracture: S62.613A

## 2018-02-21 HISTORY — DX: Displaced fracture of fifth metatarsal bone, unspecified foot, initial encounter for closed fracture: S92.353A

## 2018-02-21 SURGERY — OPEN REDUCTION INTERNAL FIXATION (ORIF) PROXIMAL PHALANX
Anesthesia: General | Site: Hand | Laterality: Left

## 2018-02-21 MED ORDER — FENTANYL CITRATE (PF) 100 MCG/2ML IJ SOLN
INTRAMUSCULAR | Status: AC
Start: 2018-02-21 — End: ?
  Filled 2018-02-21: qty 2

## 2018-02-21 MED ORDER — ACETAMINOPHEN 10 MG/ML IV SOLN
INTRAVENOUS | Status: DC | PRN
  Administered 2018-02-21: 1000 mg via INTRAVENOUS

## 2018-02-21 MED ORDER — DEXAMETHASONE SODIUM PHOSPHATE 4 MG/ML IJ SOLN
INTRAMUSCULAR | Status: DC | PRN
  Administered 2018-02-21: 10 mg via INTRAVENOUS

## 2018-02-21 MED ORDER — OXYCODONE HCL 5 MG PO TABS
ORAL_TABLET | ORAL | Status: AC
  Filled 2018-02-21: qty 1

## 2018-02-21 MED ORDER — VANCOMYCIN HCL IN DEXTROSE 1-5 GM/200ML-% IV SOLN
INTRAVENOUS | Status: AC
  Filled 2018-02-21: qty 200

## 2018-02-21 MED ORDER — CHLORHEXIDINE GLUCONATE 4 % EX LIQD
60.0000 mL | Freq: Once | CUTANEOUS | Status: DC
  Filled 2018-02-21: qty 118

## 2018-02-21 MED ORDER — KETOROLAC TROMETHAMINE 30 MG/ML IJ SOLN
INTRAMUSCULAR | Status: DC | PRN
  Administered 2018-02-21: 30 mg via INTRAVENOUS

## 2018-02-21 MED ORDER — LIDOCAINE 2% (20 MG/ML) 5 ML SYRINGE
INTRAMUSCULAR | Status: DC | PRN
  Administered 2018-02-21: 100 mg via INTRAVENOUS

## 2018-02-21 MED ORDER — FENTANYL CITRATE (PF) 100 MCG/2ML IJ SOLN
25.0000 ug | INTRAMUSCULAR | Status: DC | PRN
  Filled 2018-02-21: qty 1

## 2018-02-21 MED ORDER — FENTANYL CITRATE (PF) 100 MCG/2ML IJ SOLN
INTRAMUSCULAR | Status: DC | PRN
  Administered 2018-02-21 (×2): 50 ug via INTRAVENOUS
  Administered 2018-02-21 (×4): 25 ug via INTRAVENOUS

## 2018-02-21 MED ORDER — ONDANSETRON HCL 4 MG/2ML IJ SOLN
INTRAMUSCULAR | Status: DC | PRN
  Administered 2018-02-21: 4 mg via INTRAVENOUS

## 2018-02-21 MED ORDER — DEXAMETHASONE SODIUM PHOSPHATE 10 MG/ML IJ SOLN
INTRAMUSCULAR | Status: AC
  Filled 2018-02-21: qty 1

## 2018-02-21 MED ORDER — MIDAZOLAM HCL 2 MG/2ML IJ SOLN
INTRAMUSCULAR | Status: AC
  Filled 2018-02-21: qty 2

## 2018-02-21 MED ORDER — ACETAMINOPHEN 160 MG/5ML PO SOLN
325.0000 mg | ORAL | Status: DC | PRN
  Filled 2018-02-21: qty 20.3

## 2018-02-21 MED ORDER — LIDOCAINE 2% (20 MG/ML) 5 ML SYRINGE
INTRAMUSCULAR | Status: AC
  Filled 2018-02-21: qty 5

## 2018-02-21 MED ORDER — LACTATED RINGERS IV SOLN
INTRAVENOUS | Status: DC
  Administered 2018-02-21 (×2): via INTRAVENOUS
  Filled 2018-02-21: qty 1000

## 2018-02-21 MED ORDER — PROPOFOL 10 MG/ML IV BOLUS
INTRAVENOUS | Status: AC
  Filled 2018-02-21: qty 40

## 2018-02-21 MED ORDER — ONDANSETRON HCL 4 MG/2ML IJ SOLN
INTRAMUSCULAR | Status: AC
Start: 2018-02-21 — End: ?
  Filled 2018-02-21: qty 2

## 2018-02-21 MED ORDER — KETOROLAC TROMETHAMINE 30 MG/ML IJ SOLN
30.0000 mg | Freq: Once | INTRAMUSCULAR | Status: DC | PRN
  Filled 2018-02-21: qty 1

## 2018-02-21 MED ORDER — ONDANSETRON HCL 4 MG/2ML IJ SOLN
4.0000 mg | Freq: Once | INTRAMUSCULAR | Status: DC | PRN
  Filled 2018-02-21: qty 2

## 2018-02-21 MED ORDER — ACETAMINOPHEN 325 MG PO TABS
325.0000 mg | ORAL_TABLET | ORAL | Status: DC | PRN
  Filled 2018-02-21: qty 2

## 2018-02-21 MED ORDER — MIDAZOLAM HCL 5 MG/5ML IJ SOLN
INTRAMUSCULAR | Status: DC | PRN
  Administered 2018-02-21: 2 mg via INTRAVENOUS

## 2018-02-21 MED ORDER — VANCOMYCIN HCL IN DEXTROSE 1-5 GM/200ML-% IV SOLN
1000.0000 mg | INTRAVENOUS | Status: AC
  Administered 2018-02-21: 1000 mg via INTRAVENOUS
  Filled 2018-02-21: qty 200

## 2018-02-21 MED ORDER — MEPERIDINE HCL 25 MG/ML IJ SOLN
6.2500 mg | INTRAMUSCULAR | Status: DC | PRN
  Filled 2018-02-21: qty 1

## 2018-02-21 MED ORDER — LIDOCAINE 2% (20 MG/ML) 5 ML SYRINGE: INTRAMUSCULAR | Status: DC | PRN

## 2018-02-21 MED ORDER — OXYCODONE HCL 5 MG/5ML PO SOLN
5.0000 mg | Freq: Once | ORAL | Status: AC | PRN
  Filled 2018-02-21: qty 5

## 2018-02-21 MED ORDER — ACETAMINOPHEN 10 MG/ML IV SOLN
INTRAVENOUS | Status: AC
  Filled 2018-02-21: qty 100

## 2018-02-21 MED ORDER — OXYCODONE HCL 5 MG PO TABS
5.0000 mg | ORAL_TABLET | Freq: Once | ORAL | Status: AC | PRN
  Administered 2018-02-21: 5 mg via ORAL
  Filled 2018-02-21: qty 1

## 2018-02-21 MED ORDER — PROPOFOL 10 MG/ML IV BOLUS
INTRAVENOUS | Status: DC | PRN
  Administered 2018-02-21: 200 mg via INTRAVENOUS
  Administered 2018-02-21: 20 mg via INTRAVENOUS

## 2018-02-21 SURGICAL SUPPLY — 69 items
1.1MM DRILL BIT ×2 IMPLANT
APL SKNCLS STERI-STRIP NONHPOA (GAUZE/BANDAGES/DRESSINGS) ×1
BANDAGE ACE 3X5.8 VEL STRL LF (GAUZE/BANDAGES/DRESSINGS) ×2 IMPLANT
BANDAGE ACE 4X5 VEL STRL LF (GAUZE/BANDAGES/DRESSINGS) ×3 IMPLANT
BENZOIN TINCTURE PRP APPL 2/3 (GAUZE/BANDAGES/DRESSINGS) ×3 IMPLANT
BLADE SURG 15 STRL LF DISP TIS (BLADE) ×1 IMPLANT
BLADE SURG 15 STRL SS (BLADE) ×3
BNDG CMPR 9X4 STRL LF SNTH (GAUZE/BANDAGES/DRESSINGS) ×1
BNDG ELASTIC 2X5.8 VLCR STR LF (GAUZE/BANDAGES/DRESSINGS) IMPLANT
BNDG ESMARK 4X9 LF (GAUZE/BANDAGES/DRESSINGS) ×3 IMPLANT
BNDG GAUZE ELAST 4 BULKY (GAUZE/BANDAGES/DRESSINGS) ×3 IMPLANT
CANISTER SUCT 1200ML W/VALVE (MISCELLANEOUS) ×2 IMPLANT
CLOSURE WOUND 1/2 X4 (GAUZE/BANDAGES/DRESSINGS) ×1
CORD BIPOLAR FORCEPS 12FT (ELECTRODE) ×3 IMPLANT
COVER BACK TABLE 60X90IN (DRAPES) ×3 IMPLANT
CUFF TOURNIQUET SINGLE 18IN (TOURNIQUET CUFF) ×3 IMPLANT
DECANTER SPIKE VIAL GLASS SM (MISCELLANEOUS) IMPLANT
DRAPE EXTREMITY T 121X128X90 (DRAPE) ×3 IMPLANT
DRAPE LG THREE QUARTER DISP (DRAPES) ×3 IMPLANT
DRAPE OEC MINIVIEW 54X84 (DRAPES) ×3 IMPLANT
DRAPE SURG 17X23 STRL (DRAPES) ×3 IMPLANT
DURAPREP 26ML APPLICATOR (WOUND CARE) ×3 IMPLANT
GAUZE SPONGE 4X4 12PLY STRL (GAUZE/BANDAGES/DRESSINGS) ×3 IMPLANT
GAUZE SPONGE 4X4 16PLY XRAY LF (GAUZE/BANDAGES/DRESSINGS) IMPLANT
GAUZE XEROFORM 1X8 LF (GAUZE/BANDAGES/DRESSINGS) ×2 IMPLANT
GLOVE SURG SYN 8.0 (GLOVE) ×3 IMPLANT
GLOVE SURG SYN 8.0 PF PI (GLOVE) ×1 IMPLANT
GOWN STRL REIN XL XLG (GOWN DISPOSABLE) ×6 IMPLANT
GOWN STRL REUS W/ TWL LRG LVL3 (GOWN DISPOSABLE) ×1 IMPLANT
GOWN STRL REUS W/TWL LRG LVL3 (GOWN DISPOSABLE) ×3
NDL HYPO 25X1 1.5 SAFETY (NEEDLE) IMPLANT
NEEDLE HYPO 25X1 1.5 SAFETY (NEEDLE) ×3 IMPLANT
NS IRRIG 1000ML POUR BTL (IV SOLUTION) IMPLANT
PACK BASIN DAY SURGERY FS (CUSTOM PROCEDURE TRAY) ×3 IMPLANT
PAD CAST 3X4 CTTN HI CHSV (CAST SUPPLIES) ×1 IMPLANT
PAD CAST 4YDX4 CTTN HI CHSV (CAST SUPPLIES) IMPLANT
PADDING CAST ABS 4INX4YD NS (CAST SUPPLIES) ×2
PADDING CAST ABS COTTON 4X4 ST (CAST SUPPLIES) ×1 IMPLANT
PADDING CAST COTTON 3X4 STRL (CAST SUPPLIES) ×3
PADDING CAST COTTON 4X4 STRL (CAST SUPPLIES) ×3
PADDING UNDERCAST 2 STRL (CAST SUPPLIES) ×2
PADDING UNDERCAST 2X4 STRL (CAST SUPPLIES) ×1 IMPLANT
SCREW CORTEX 1.5X10 (Screw) ×2 IMPLANT
SCREW CORTEX 1.5X12 (Screw) ×2 IMPLANT
SLEEVE SCD COMPRESS KNEE MED (MISCELLANEOUS) ×3 IMPLANT
SLING ARM FOAM STRAP XLG (SOFTGOODS) ×2 IMPLANT
SLING ARM XL FOAM STRAP (SOFTGOODS) ×2 IMPLANT
SPLINT PLASTER CAST XFAST 4X15 (CAST SUPPLIES) IMPLANT
SPLINT PLASTER XTRA FAST SET 4 (CAST SUPPLIES) ×2
STOCKINETTE 4X48 STRL (DRAPES) ×3 IMPLANT
STRIP CLOSURE SKIN 1/2X4 (GAUZE/BANDAGES/DRESSINGS) ×2 IMPLANT
SUCTION FRAZIER HANDLE 10FR (MISCELLANEOUS)
SUCTION TUBE FRAZIER 10FR DISP (MISCELLANEOUS) IMPLANT
SUT ETHILON 4 0 PS 2 18 (SUTURE) ×2 IMPLANT
SUT FIBERWIRE 4-0 18 TAPR NDL (SUTURE) ×3
SUT MERSILENE 4 0 P 3 (SUTURE) IMPLANT
SUT PROLENE 3 0 PS 2 (SUTURE) IMPLANT
SUT VIC AB 4-0 P-3 18XBRD (SUTURE) IMPLANT
SUT VIC AB 4-0 P3 18 (SUTURE)
SUT VICRYL 4-0 PS2 18IN ABS (SUTURE) ×3 IMPLANT
SUT VICRYL+ 3-0 27IN RB-1 (SUTURE) IMPLANT
SUTURE FIBERWR 4-0 18 TAPR NDL (SUTURE) IMPLANT
SYR 10ML LL (SYRINGE) IMPLANT
SYR BULB 3OZ (MISCELLANEOUS) IMPLANT
SYR CONTROL 10ML LL (SYRINGE) ×2 IMPLANT
TOWEL OR 17X24 6PK STRL BLUE (TOWEL DISPOSABLE) ×5 IMPLANT
TUBE CONNECTING 20'X1/4 (TUBING)
TUBE CONNECTING 20X1/4 (TUBING) IMPLANT
UNDERPAD 30X30 (UNDERPADS AND DIAPERS) ×3 IMPLANT

## 2018-02-21 NOTE — Discharge Instructions (Signed)

## 2018-02-21 NOTE — Transfer of Care (Signed)
Last Vitals:  Vitals Value Taken Time  BP 125/83 02/21/2018  2:30 PM  Temp 36.4 C 02/21/2018  2:27 PM  Pulse 64 02/21/2018  2:33 PM  Resp 23 02/21/2018  2:33 PM  SpO2 100 % 02/21/2018  2:33 PM  Vitals shown include unvalidated device data.  Last Pain:  Vitals:   02/21/18 1427  TempSrc:   PainSc: Asleep      Patients Stated Pain Goal: 8 (02/21/18 1042) Immediate Anesthesia Transfer of Care Note  Patient: Alfred Morrison  Procedure(s) Performed: Procedure(s) (LRB): OPEN REDUCTION INTERNAL FIXATION (ORIF) LEFT LONG PROXIMAL PHALANX (Left)  Patient Location: PACU  Anesthesia Type: General  Level of Consciousness: awake, alert  and oriented  Airway & Oxygen Therapy: Patient Spontanous Breathing and Patient connected to nasal cannula oxygen  Post-op Assessment: Report given to PACU RN and Post -op Vital signs reviewed and stable  Post vital signs: Reviewed and stable  Complications: No apparent anesthesia complications

## 2018-02-21 NOTE — Anesthesia Procedure Notes (Addendum)
Procedure Name: LMA Insertion Date/Time: 02/21/2018 12:42 PM Performed by: Leilani Able, MD Pre-anesthesia Checklist: Patient identified, Emergency Drugs available, Suction available and Patient being monitored Patient Re-evaluated:Patient Re-evaluated prior to induction Oxygen Delivery Method: Circle system utilized Preoxygenation: Pre-oxygenation with 100% oxygen Induction Type: IV induction Ventilation: Mask ventilation without difficulty LMA: LMA inserted LMA Size: 4.0 Number of attempts: 1 Airway Equipment and Method: Bite block Placement Confirmation: positive ETCO2 Tube secured with: Tape Dental Injury: Teeth and Oropharynx as per pre-operative assessment

## 2018-02-21 NOTE — Op Note (Signed)
NAMEZACKARIA, BURKEY MEDICAL RECORD EX:[id number] ACCOUNT [id number] DATE OF BIRTH:1986/06/28 FACILITY: [facility] LOCATION: [facility]-PERI[facility] PHYSICIAN:Ramah Langhans A. Mina Marble, MD  OPERATIVE REPORT  DATE OF PROCEDURE:  02/21/2018  PREOPERATIVE DIAGNOSIS:  Intraarticular fracture, left long proximal phalanx.  POSTOPERATIVE DIAGNOSIS:  Intraarticular fracture, left long proximal phalanx.  PROCEDURE:  Open reduction internal fixation, left long finger proximal phalangeal fracture.  SURGEON:  Dairl Ponder, MD  ASSISTANT:  None.  ANESTHESIA:  General.  COMPLICATIONS:  None.   DRAINS:  None.  DESCRIPTION OF PROCEDURE:  The patient was taken to the operating suite after induction of adequate general anesthetic.  The left upper extremity was prepped and draped in sterile fashion.  An Esmarch was used to exsanguinate the limb.  The tourniquet  was then inflated to 250 mmHg.  At this point in time, an incision was made over the proximal interphalangeal joint and proximal phalanx of the left long finger.  A radially based flap was elevated.  Dissection was carried down to the extensor mechanism  that was split.  Dissection was carried down to the fracture site.  The fracture site was debrided of clot.  Reduction was performed with longitudinal traction.  It was intraarticular with a spike going to the PIP joint along the radial side.  It took  several attempts of reduction to finally get the reduction clamp appropriately placed.  Once reduction was performed, two 1.5 mm screws were placed from ulnar to radial, 1 perpendicular to the long axis of the bone and 1 perpendicular to the long axis of  the fracture, 1.5 mm in lag fashion.  Fluoroscopic imaging revealed adequate reduction in both the AP and lateral view.  The wound was thoroughly irrigated.  The periosteum closed, and then the extensor mechanism was closed for a running locked  FiberWire suture, followed by skin closure with 4-0 nylon.  Xeroform,  4 x 4's, fluffs and a dorsal extension block splint were applied.  The patient tolerated this procedure well and went to recovery in stable fashion.  LN/NUANCE  D:02/21/2018 T:02/21/2018 JOB:[id number]

## 2018-02-21 NOTE — Anesthesia Preprocedure Evaluation (Signed)
Anesthesia Evaluation  Patient identified by MRN, date of birth, ID band Patient awake    Reviewed: Allergy & Precautions, NPO status , Patient's Chart, lab work & pertinent test results  Airway Mallampati: I       Dental no notable dental hx. (+) Teeth Intact   Pulmonary Current Smoker,    Pulmonary exam normal breath sounds clear to auscultation       Cardiovascular negative cardio ROS Normal cardiovascular exam Rhythm:Regular Rate:Normal     Neuro/Psych negative neurological ROS  negative psych ROS   GI/Hepatic negative GI ROS, Neg liver ROS,   Endo/Other  negative endocrine ROS  Renal/GU negative Renal ROS  negative genitourinary   Musculoskeletal negative musculoskeletal ROS (+)   Abdominal Normal abdominal exam  (+)   Peds  Hematology negative hematology ROS (+)   Anesthesia Other Findings   Reproductive/Obstetrics negative OB ROS                             Anesthesia Physical Anesthesia Plan  ASA: II  Anesthesia Plan: General   Post-op Pain Management:    Induction: Intravenous  PONV Risk Score and Plan: 2 and Ondansetron and Dexamethasone  Airway Management Planned: LMA  Additional Equipment:   Intra-op Plan:   Post-operative Plan:   Informed Consent: I have reviewed the patients History and Physical, chart, labs and discussed the procedure including the risks, benefits and alternatives for the proposed anesthesia with the patient or authorized representative who has indicated his/her understanding and acceptance.   Dental advisory given  Plan Discussed with: CRNA and Surgeon  Anesthesia Plan Comments:         Anesthesia Quick Evaluation

## 2018-02-21 NOTE — Op Note (Signed)
Please see dictated report [phone]
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
## 2018-02-23 NOTE — Anesthesia Postprocedure Evaluation (Signed)
Anesthesia Post Note  Patient: Alfred Morrison  Procedure(s) Performed: OPEN REDUCTION INTERNAL FIXATION (ORIF) LEFT LONG PROXIMAL PHALANX (Left Hand)     Patient location during evaluation: PACU Anesthesia Type: General Level of consciousness: awake and alert Pain management: pain level controlled Vital Signs Assessment: post-procedure vital signs reviewed and stable Respiratory status: spontaneous breathing, nonlabored ventilation, respiratory function stable and patient connected to nasal cannula oxygen Cardiovascular status: blood pressure returned to baseline and stable Postop Assessment: no apparent nausea or vomiting Anesthetic complications: no    Last Vitals:  Vitals:   02/21/18 1530 02/21/18 1615  BP: 130/89 (!) 138/94  Pulse: 95 86  Resp: 18 16  Temp:  36.8 C  SpO2: 100% 99%    Last Pain:  Vitals:   02/21/18 1600  TempSrc:   PainSc: 4                  Ryan P Ellender

## 2018-02-26 ENCOUNTER — Encounter (HOSPITAL_BASED_OUTPATIENT_CLINIC_OR_DEPARTMENT_OTHER): Payer: Self-pay | Admitting: Orthopedic Surgery

## 2019-08-19 ENCOUNTER — Emergency Department (HOSPITAL_COMMUNITY)
Admission: EM | Admit: 2019-08-19 | Discharge: 2019-08-19 | Disposition: A | Discharge status: Home / Self Care | Payer: Medicaid Other | Attending: Emergency Medicine | Admitting: Emergency Medicine

## 2019-08-19 ENCOUNTER — Other Ambulatory Visit: Payer: Self-pay

## 2019-08-19 ENCOUNTER — Encounter (HOSPITAL_COMMUNITY): Payer: Self-pay | Admitting: Obstetrics and Gynecology

## 2019-08-19 DIAGNOSIS — R1110 Vomiting, unspecified: Secondary | ICD-10-CM | POA: Insufficient documentation

## 2019-08-19 DIAGNOSIS — R197 Diarrhea, unspecified: Secondary | ICD-10-CM | POA: Insufficient documentation

## 2019-08-19 DIAGNOSIS — Z5321 Procedure and treatment not carried out due to patient leaving prior to being seen by health care provider: Secondary | ICD-10-CM | POA: Insufficient documentation

## 2019-08-19 NOTE — ED Triage Notes (Signed)
Patient reports to the ED with c/o one episode of emesis and diarrhea. Patient reports he has had no emesis or nausea since. Patient states he has had a BM that was normal.

## 2021-04-02 ENCOUNTER — Emergency Department (HOSPITAL_COMMUNITY)
Admission: EM | Admit: 2021-04-02 | Discharge: 2021-04-02 | Disposition: A | Discharge status: Home / Self Care | Payer: Medicaid Other | Attending: Emergency Medicine | Admitting: Emergency Medicine

## 2021-04-02 ENCOUNTER — Encounter (HOSPITAL_COMMUNITY): Payer: Self-pay

## 2021-04-02 ENCOUNTER — Other Ambulatory Visit: Payer: Self-pay

## 2021-04-02 DIAGNOSIS — F17210 Nicotine dependence, cigarettes, uncomplicated: Secondary | ICD-10-CM | POA: Insufficient documentation

## 2021-04-02 DIAGNOSIS — R1030 Lower abdominal pain, unspecified: Secondary | ICD-10-CM | POA: Insufficient documentation

## 2021-04-02 DIAGNOSIS — Z202 Contact with and (suspected) exposure to infections with a predominantly sexual mode of transmission: Secondary | ICD-10-CM | POA: Insufficient documentation

## 2021-04-02 DIAGNOSIS — Z91040 Latex allergy status: Secondary | ICD-10-CM | POA: Insufficient documentation

## 2021-04-02 LAB — URINALYSIS, ROUTINE W REFLEX MICROSCOPIC
Bilirubin Urine: NEGATIVE
Glucose, UA: NEGATIVE mg/dL
Hgb urine dipstick: NEGATIVE
Ketones, ur: NEGATIVE mg/dL
Leukocytes,Ua: NEGATIVE
Nitrite: NEGATIVE
Protein, ur: NEGATIVE mg/dL
Specific Gravity, Urine: 1.005 (ref 1.005–1.030)
pH: 5 (ref 5.0–8.0)
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
## 2021-04-02 NOTE — Discharge Instructions (Addendum)
We will call you if your gonorrhea or chlamydia tests are positive.  You urinalysis didn't show any sign of infection.

## 2021-04-02 NOTE — ED Triage Notes (Signed)
Pt to ED with c/o lower abdominal pain and evaluation following exposure to STD. Pt received a letter stating he had been with a partner who had tested positive. Arrives A+O, VSS, NADN.

## 2021-07-11 ENCOUNTER — Emergency Department (HOSPITAL_COMMUNITY): Payer: Self-pay

## 2021-07-11 ENCOUNTER — Encounter (HOSPITAL_COMMUNITY): Payer: Self-pay

## 2021-07-11 ENCOUNTER — Emergency Department (HOSPITAL_COMMUNITY)
Admission: EM | Admit: 2021-07-11 | Discharge: 2021-07-11 | Disposition: A | Discharge status: Home / Self Care | Payer: Self-pay | Attending: Emergency Medicine | Admitting: Emergency Medicine

## 2021-07-11 ENCOUNTER — Other Ambulatory Visit: Payer: Self-pay

## 2021-07-11 DIAGNOSIS — Z91040 Latex allergy status: Secondary | ICD-10-CM | POA: Insufficient documentation

## 2021-07-11 DIAGNOSIS — R0789 Other chest pain: Secondary | ICD-10-CM | POA: Insufficient documentation

## 2021-07-11 DIAGNOSIS — M25511 Pain in right shoulder: Secondary | ICD-10-CM | POA: Insufficient documentation

## 2021-07-11 DIAGNOSIS — T148XXA Other injury of unspecified body region, initial encounter: Secondary | ICD-10-CM

## 2021-07-11 DIAGNOSIS — F17210 Nicotine dependence, cigarettes, uncomplicated: Secondary | ICD-10-CM | POA: Insufficient documentation

## 2021-07-11 LAB — URINALYSIS, ROUTINE W REFLEX MICROSCOPIC
Bilirubin Urine: NEGATIVE
Glucose, UA: NEGATIVE mg/dL
Hgb urine dipstick: NEGATIVE
Ketones, ur: NEGATIVE mg/dL
Leukocytes,Ua: NEGATIVE
Nitrite: NEGATIVE
Protein, ur: NEGATIVE mg/dL
Specific Gravity, Urine: <1.005 — ABNORMAL LOW (ref 1.005–1.030)
pH: 6 (ref 5.0–8.0)

## 2021-07-11 MED ORDER — KETOROLAC TROMETHAMINE 30 MG/ML IJ SOLN
30.0000 mg | Freq: Once | INTRAMUSCULAR | Status: AC
  Administered 2021-07-11: 30 mg via INTRAMUSCULAR
  Filled 2021-07-11: qty 1

## 2021-07-11 MED ORDER — LIDOCAINE 5 % EX PTCH
1.0000 | MEDICATED_PATCH | CUTANEOUS | Status: DC
  Administered 2021-07-11: 1 via TRANSDERMAL
  Filled 2021-07-11: qty 1

## 2021-07-11 MED ORDER — LIDOCAINE 5 % EX PTCH: 1.0000 | MEDICATED_PATCH | CUTANEOUS | 0 refills | Status: AC

## 2021-07-11 MED ORDER — NAPROXEN 500 MG PO TABS: 500.0000 mg | ORAL_TABLET | Freq: Two times a day (BID) | ORAL | 0 refills | Status: AC
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
## 2021-07-11 NOTE — Discharge Instructions (Addendum)
Your urine did not show any blood that would be concerning for a kidney infection.  The imaging did not show any bony involvement, expected pain is likely due to muscle strain.  Take naproxen twice daily with food and water for the next week.  You can take Tylenol in between that as needed for breakthrough pain.  Follow-up with the orthopedic group if things fail to improve in the next few weeks.

## 2021-07-11 NOTE — ED Triage Notes (Signed)
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 Pt report right ribcage pain 3 weeks ago and now radiating down right side of back.   9/10 pain  Pt reports no relief with ibuprofen or goody powders.  A/Ox4 Ambulatory in triage

## 2021-10-02 IMAGING — CR DG SHOULDER 2+V*R*
3 series · 3 of 3 positions shown · non-contrast
Comparison: None

CLINICAL DATA: Pt reports he thinks he strained a muscle from
moving a grill at work. Pt report right anterior ribcage pain 3
weeks ago and now radiating down right side of back. Pt. Also c/o
right posterior shoulder pain. BB marks area of interest for rib
x-rays. shoulder pain

EXAM:
RIGHT SHOULDER - 2+ VIEW

[w shoulder external right]
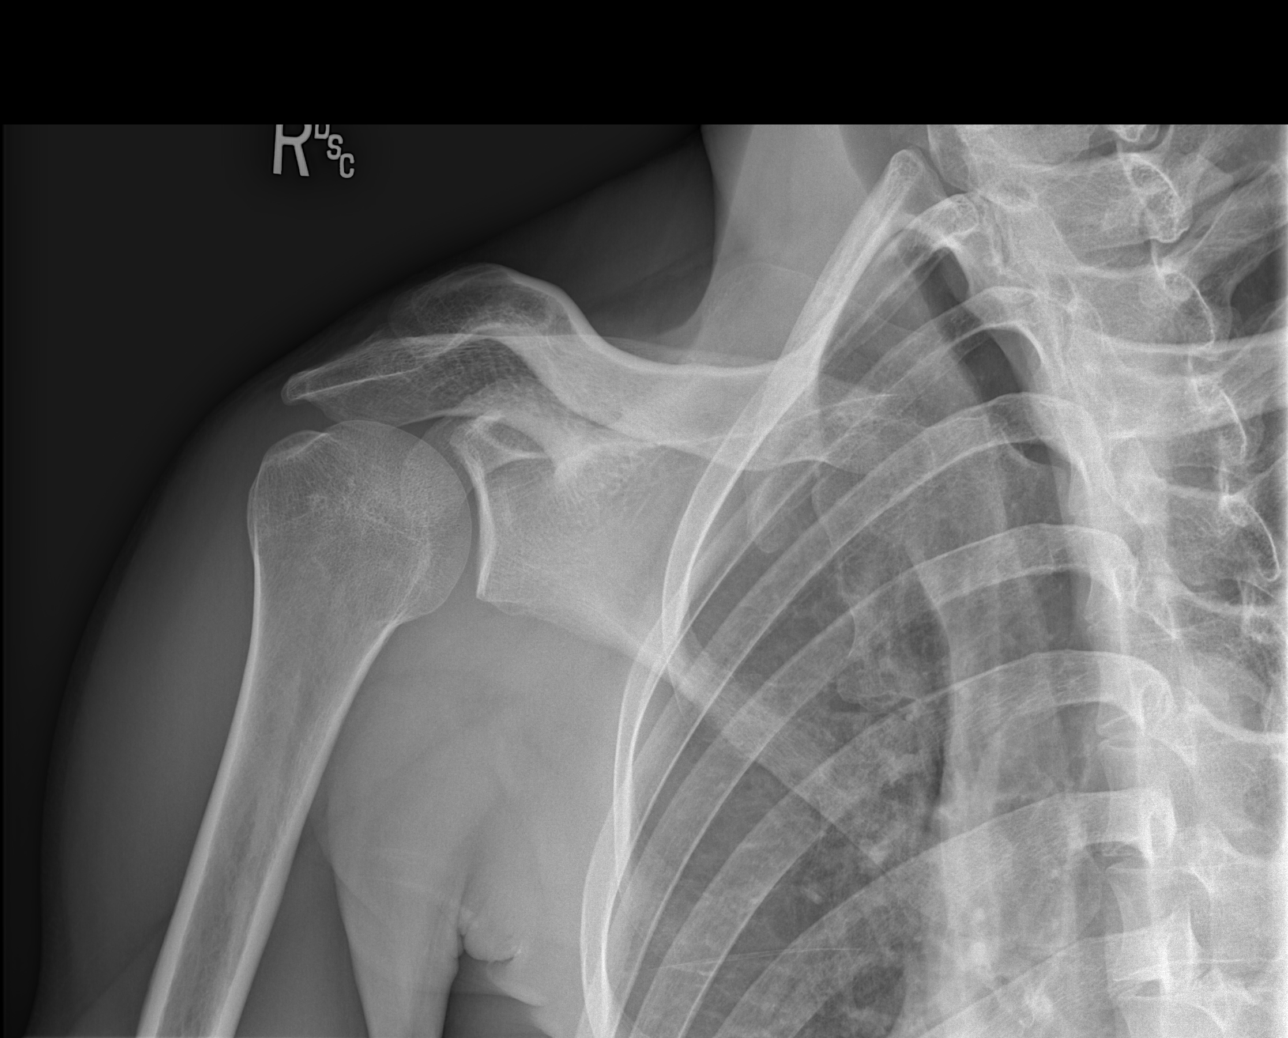

[w shoulder y-view right]
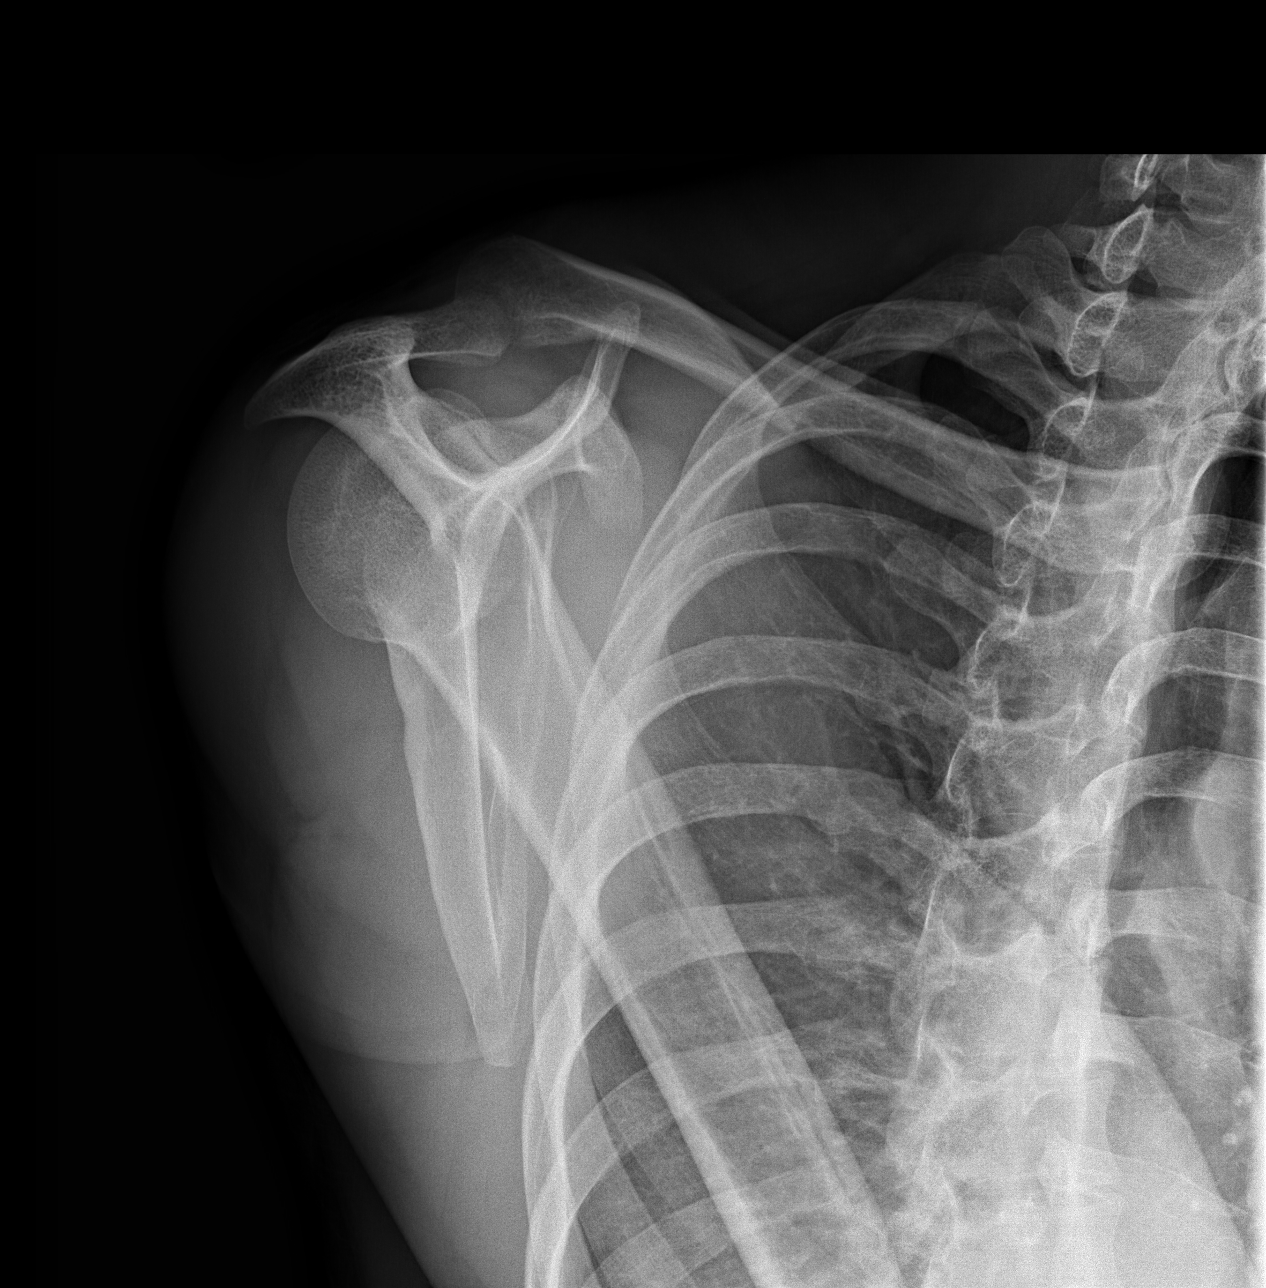

[x shoulder axillary right]
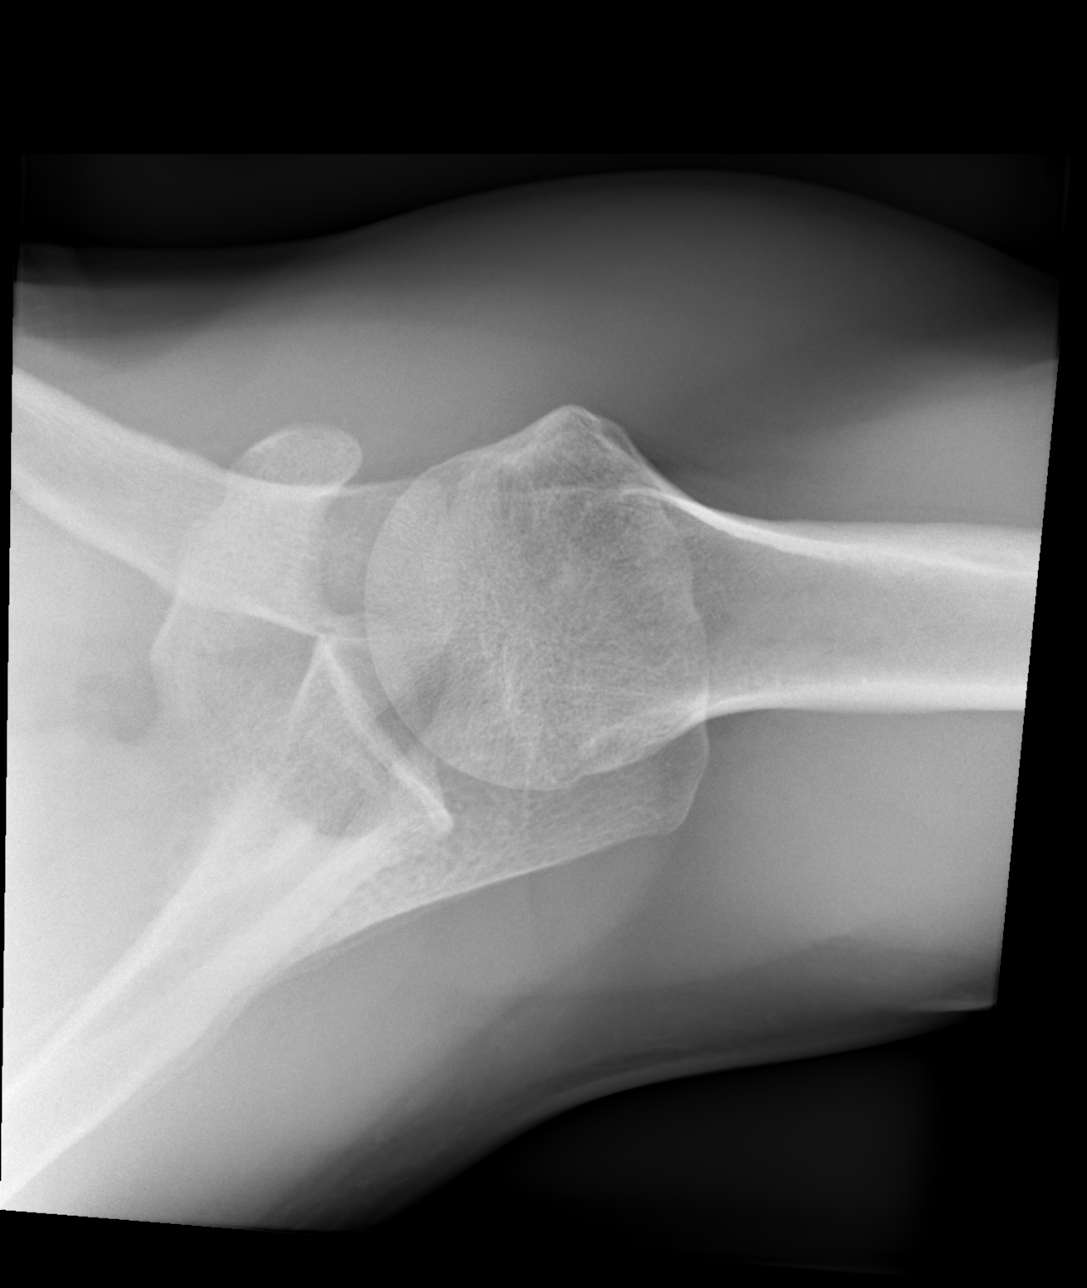

[3 of 3 positions shown; findings below may reference images not displayed]

FINDINGS: Glenohumeral joint is intact. No evidence of scapular fracture or
humeral fracture. The acromioclavicular joint is intact.
IMPRESSION: No fracture or dislocation.

## 2023-10-22 ENCOUNTER — Emergency Department (HOSPITAL_COMMUNITY)
Admission: EM | Admit: 2023-10-22 | Discharge: 2023-10-23 | Disposition: A | Discharge status: Home / Self Care | Payer: Self-pay | Attending: Emergency Medicine | Admitting: Emergency Medicine

## 2023-10-22 ENCOUNTER — Encounter (HOSPITAL_COMMUNITY): Payer: Self-pay

## 2023-10-22 ENCOUNTER — Other Ambulatory Visit: Payer: Self-pay

## 2023-10-22 DIAGNOSIS — H209 Unspecified iridocyclitis: Secondary | ICD-10-CM | POA: Insufficient documentation

## 2023-10-22 DIAGNOSIS — Z91040 Latex allergy status: Secondary | ICD-10-CM | POA: Insufficient documentation

## 2023-10-22 DIAGNOSIS — W01198A Fall on same level from slipping, tripping and stumbling with subsequent striking against other object, initial encounter: Secondary | ICD-10-CM | POA: Insufficient documentation

## 2023-10-22 NOTE — ED Triage Notes (Signed)
 Pt was standing on chair x 3 days ago when he slipped and hit his right eye on the corner of his washing machine. Pt has redness and drainage from eye. He also has photophobia and reports feeling pressure behind his eye.

## 2023-10-23 ENCOUNTER — Emergency Department (HOSPITAL_COMMUNITY): Payer: Self-pay

## 2023-10-23 MED ORDER — CYCLOPENTOLATE HCL 1 % OP SOLN
1.0000 [drp] | Freq: Once | OPHTHALMIC | Status: AC
  Administered 2023-10-23: 1 [drp] via OPHTHALMIC
  Filled 2023-10-23: qty 2

## 2023-10-23 MED ORDER — PREDNISOLONE ACETATE 1 % OP SUSP
1.0000 [drp] | Freq: Once | OPHTHALMIC | Status: AC
  Administered 2023-10-23: 1 [drp] via OPHTHALMIC
  Filled 2023-10-23: qty 5

## 2023-10-23 MED ORDER — PREDNISOLONE ACETATE 1 % OP SUSP: 1.0000 [drp] | Freq: Four times a day (QID) | OPHTHALMIC | 0 refills | Status: AC

## 2023-10-23 MED ORDER — CYCLOPENTOLATE HCL 1 % OP SOLN: 1.0000 [drp] | Freq: Three times a day (TID) | OPHTHALMIC | 0 refills | Status: AC

## 2023-10-23 MED ORDER — TETRACAINE HCL 0.5 % OP SOLN
1.0000 [drp] | Freq: Once | OPHTHALMIC | Status: AC
  Administered 2023-10-23: 1 [drp] via OPHTHALMIC
  Filled 2023-10-23: qty 4

## 2023-10-23 MED ORDER — FLUORESCEIN SODIUM 1 MG OP STRP
1.0000 | ORAL_STRIP | Freq: Once | OPHTHALMIC | Status: AC
  Administered 2023-10-23: 1 via OPHTHALMIC
  Filled 2023-10-23: qty 1
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
## 2023-10-23 NOTE — Discharge Instructions (Signed)
 You were seen today for injury to the right eye.  You likely have some traumatic iritis or uveitis.  Take medications as prescribed.  It is importantly follow-up with ophthalmology for formal eye exam.

## 2023-12-03 ENCOUNTER — Ambulatory Visit
Admission: EM | Admit: 2023-12-03 | Discharge: 2023-12-03 | Disposition: A | Discharge status: Home / Self Care | Payer: No Typology Code available for payment source | Attending: Family Medicine | Admitting: Family Medicine

## 2023-12-03 ENCOUNTER — Encounter: Payer: Self-pay | Admitting: Emergency Medicine

## 2023-12-03 DIAGNOSIS — J111 Influenza due to unidentified influenza virus with other respiratory manifestations: Secondary | ICD-10-CM | POA: Diagnosis not present

## 2023-12-03 DIAGNOSIS — Z20828 Contact with and (suspected) exposure to other viral communicable diseases: Secondary | ICD-10-CM | POA: Diagnosis not present

## 2023-12-03 LAB — POC COVID19/FLU A&B COMBO
Covid Antigen, POC: NEGATIVE
Influenza A Antigen, POC: NEGATIVE
Influenza B Antigen, POC: NEGATIVE

## 2023-12-03 LAB — POCT RAPID STREP A (OFFICE): Rapid Strep A Screen: NEGATIVE

## 2023-12-03 MED ORDER — ALBUTEROL SULFATE HFA 108 (90 BASE) MCG/ACT IN AERS
2.0000 | INHALATION_SPRAY | Freq: Once | RESPIRATORY_TRACT | Status: AC
  Administered 2023-12-03: 2 via RESPIRATORY_TRACT

## 2023-12-03 MED ORDER — OSELTAMIVIR PHOSPHATE 75 MG PO CAPS: 75.0000 mg | ORAL_CAPSULE | Freq: Two times a day (BID) | ORAL | 0 refills | Status: AC

## 2023-12-03 MED ORDER — PROMETHAZINE-DM 6.25-15 MG/5ML PO SYRP: 5.0000 mL | ORAL_SOLUTION | ORAL | 0 refills | Status: AC | PRN

## 2023-12-03 MED ORDER — PREDNISONE 20 MG PO TABS: 20.0000 mg | ORAL_TABLET | Freq: Two times a day (BID) | ORAL | 0 refills | Status: AC
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
## 2023-12-03 NOTE — Discharge Instructions (Addendum)
Treating you for an influenza-like illness. You are considered contagious to others as long as you have a measurable fever with a temperature 100 F.  You should consider yourself infectious until you are fever free for 24 hours without fever lowering medications. Continue to alternate Tylenol and ibuprofen for management of fever.   Force fluids to maintain hydration. Tamiflu twice daily for the next 5 days to reduce symptoms and course of influenza virus.  Start prednisone 20 mg twice daily for 5 days to decrease inflammation in your chest and help improve work of breathing.  Promethazine DM as needed for cough .  Butyryl inhaler 2 puffs every 4-6 hours as needed for chest tightness, shortness of breath, wheezing.  If you develop any shortness of breath, wheezing or difficulty breathing go immediately to the nearest emergency department.

## 2023-12-03 NOTE — ED Triage Notes (Addendum)
Pt presents with Flu like symptoms that onset 12/01/2023. Pt denies emesis and dizziness but does state he get SOB after a coughing spell. Pt states he has not had an appetite since yesterday.
